# Patient Record
Sex: Female | Born: 2000
Health system: Southern US, Community
[De-identification: ages and names within clinical notes are randomized; demographics above are authoritative.]

## PROBLEM LIST (undated history)

## (undated) DIAGNOSIS — F419 Anxiety disorder, unspecified: Secondary | ICD-10-CM

## (undated) HISTORY — PX: EYE SURGERY: SHX253

## (undated) HISTORY — DX: Anxiety disorder, unspecified: F41.9

---
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
## 2021-10-18 ENCOUNTER — Encounter (HOSPITAL_COMMUNITY): Payer: Self-pay

## 2021-10-18 ENCOUNTER — Ambulatory Visit (HOSPITAL_COMMUNITY)
Admission: EM | Admit: 2021-10-18 | Discharge: 2021-10-18 | Disposition: A | Discharge status: Home / Self Care | Payer: 59 | Source: Ambulatory Visit | Attending: Family Medicine | Admitting: Family Medicine

## 2021-10-18 VITALS — BP 120/76 | HR 73 | Temp 98.6°F | Resp 16

## 2021-10-18 DIAGNOSIS — H1033 Unspecified acute conjunctivitis, bilateral: Secondary | ICD-10-CM | POA: Diagnosis not present

## 2021-10-18 DIAGNOSIS — H1030 Unspecified acute conjunctivitis, unspecified eye: Secondary | ICD-10-CM

## 2021-10-18 MED ORDER — MOXIFLOXACIN HCL 0.5 % OP SOLN: 1.0000 [drp] | Freq: Three times a day (TID) | OPHTHALMIC | 0 refills | Status: AC

## 2021-10-18 NOTE — ED Triage Notes (Signed)
C/o bilateral eye redness and swelling that started yesterday.  ?

## 2021-10-18 NOTE — ED Provider Notes (Signed)
?[facility] ? ? ? ?CSN: [id number] ?Arrival date & time: 10/18/21  1449 ? ? ?  ? ?History   ?Chief Complaint ?Chief Complaint  ?Patient presents with  ? Eye Problem  ?  Entered by patient  ? ? ?HPI ?[name] is a 21 y.o. female.  ? ?Patient is here for eye redness and drainage that started yesterday.  Right worse than left.  Drainage is tears/watering.  ?She wears contacts, and slightly sensative when she blinks.  ?Not very itchy.  ?She is not wearing contacts currently.  ?No FB noted or any injury.  ? ?History reviewed. No pertinent past medical history. ? ?Patient Active Problem List  ? Diagnosis Date Noted  ? Ovarian cyst rupture 04/03/2019  ? ? ?Past Surgical History:  ?Procedure Laterality Date  ? LAPAROSCOPIC APPENDECTOMY N/A 04/03/2019  ? Procedure: APPENDECTOMY LAPAROSCOPIC;  Surgeon: Leonia Corona, MD;  Location: [facility] OR;  Service: Pediatrics;  Laterality: N/A;  ? ? ?OB History   ?No obstetric history on file. ?  ? ? ? ?Home Medications   ? ?Prior to Admission medications   ?Not on File  ? ? ?Family History ?History reviewed. No pertinent family history. ? ?Social History ?Social History  ? ?Tobacco Use  ? Smoking status: Never  ? Smokeless tobacco: Never  ?Substance Use Topics  ? Alcohol use: Never  ? Drug use: Never  ? ? ? ?Allergies   ?Patient has no known allergies. ? ? ?Review of Systems ?Review of Systems  ?Constitutional: Negative.   ?HENT: Negative.    ?Eyes:  Positive for photophobia, discharge and redness.  ?Respiratory: Negative.    ?Cardiovascular: Negative.   ?Gastrointestinal: Negative.   ? ? ?Physical Exam ?Triage Vital Signs ?ED Triage Vitals [10/18/21 1510]  ?Enc Vitals Group  ?   BP 120/76  ?   Pulse Rate 73  ?   Resp 16  ?   Temp 98.6 ?F (37 ?C)  ?   Temp Source Oral  ?   SpO2 100 %  ?   Weight   ?   Height   ?   Head Circumference   ?   Peak Flow   ?   Pain Score   ?   Pain Loc   ?   Pain Edu?   ?   Excl. in GC?   ? ?No data found. ? ?Updated Vital Signs ?BP 120/76 (BP  Location: Left Arm)   Pulse 73   Temp 98.6 ?F (37 ?C) (Oral)   Resp 16   SpO2 100%  ? ?Visual Acuity ?Right Eye Distance:   ?Left Eye Distance:   ?Bilateral Distance:   ? ?Right Eye Near:   ?Left Eye Near:    ?Bilateral Near:    ? ?Physical Exam ?Constitutional:   ?   Appearance: Normal appearance.  ?HENT:  ?   Head: Normocephalic and atraumatic.  ?Eyes:  ?   Conjunctiva/sclera:  ?   Right eye: Right conjunctiva is injected. Exudate and hemorrhage present.  ?   Left eye: Left conjunctiva is injected.  ?Cardiovascular:  ?   Rate and Rhythm: Normal rate and regular rhythm.  ?Pulmonary:  ?   Effort: Pulmonary effort is normal.  ?   Breath sounds: Normal breath sounds.  ?Neurological:  ?   Mental Status: She is alert.  ? ? ? ?UC Treatments / Results  ?Labs ?(all labs ordered are listed, but only abnormal results are displayed) ?Labs Reviewed - No data to  display ? ?EKG ? ? ?Radiology ?No results found. ? ?Procedures ?Procedures (including critical care time) ? ?Medications Ordered in UC ?Medications - No data to display ? ?Initial Impression / Assessment and Plan / UC Course  ?I have reviewed the triage vital signs and the nursing notes. ? ?Pertinent labs & imaging results that were available during my care of the patient were reviewed by me and considered in my medical decision making (see chart for details). ? ?  ? ?Final Clinical Impressions(s) / UC Diagnoses  ? ?Final diagnoses:  ?Acute conjunctivitis, unspecified acute conjunctivitis type, unspecified laterality  ? ? ? ?Discharge Instructions   ? ?  ?You were seen for conjuctivitis.  I have sent out an antibiotic eye drop to use three times/day x 7 days.  I also recommend you use over the counter zaditor eye drops for any allergic component.  ?If your symptoms do not improve, or worsen, then please see your eye doctor or go to the ER for evaluation.  ? ? ? ?ED Prescriptions   ? ? Medication Sig Dispense Auth. Provider  ? moxifloxacin (VIGAMOX) 0.5 % ophthalmic  solution Place 1 drop into both eyes 3 (three) times daily for 7 days. 3 mL Jannifer Franklin, MD  ? ?  ? ?PDMP not reviewed this encounter. ?  Jannifer Franklin, MD ?10/18/21 1538 ? ?

## 2021-10-18 NOTE — Discharge Instructions (Signed)
You were seen for conjuctivitis.  I have sent out an antibiotic eye drop to use three times/day x 7 days.  I also recommend you use over the counter zaditor eye drops for any allergic component.  ?If your symptoms do not improve, or worsen, then please see your eye doctor or go to the ER for evaluation.  ?

## 2022-07-01 ENCOUNTER — Ambulatory Visit: Payer: 59 | Admitting: Nurse Practitioner

## 2022-07-08 ENCOUNTER — Encounter: Payer: Self-pay | Admitting: Nurse Practitioner

## 2022-07-08 ENCOUNTER — Ambulatory Visit: Payer: 59 | Admitting: Nurse Practitioner

## 2022-07-08 VITALS — BP 112/70 | HR 78 | Temp 98.1°F | Ht 62.5 in | Wt 133.2 lb

## 2022-07-08 DIAGNOSIS — Z0000 Encounter for general adult medical examination without abnormal findings: Secondary | ICD-10-CM | POA: Diagnosis not present

## 2022-07-08 DIAGNOSIS — N946 Dysmenorrhea, unspecified: Secondary | ICD-10-CM | POA: Insufficient documentation

## 2022-07-08 HISTORY — DX: Encounter for general adult medical examination without abnormal findings: Z00.00

## 2022-07-08 NOTE — Patient Instructions (Addendum)
It was a pleasure to meet you today.   If you have any needs, I am here.

## 2022-07-08 NOTE — Progress Notes (Signed)
[name], DNP, AGNP-c [facility] [address] [place], [place] [address] Main Office [phone]  BP 112/70   Pulse 78   Temp 98.1 F (36.7 C)   Ht 5' 2.5" (1.588 m)   Wt 133 lb 3.2 oz (60.4 kg)   LMP 07/02/2022   BMI 23.97 kg/m    Subjective:    Patient ID: Jillian Harris, female    DOB: November 09, 2000, 21 y.o.   MRN: [id number]  HPI: ANGLE DIRUSSO is a 21 y.o. female presenting on 07/08/2022 for comprehensive medical examination as a new patient.   Current medical concerns include:none  She reports regular vision exams q1-5y: Yes  She reports regular dental exams q 44m:  Yes  The patient eats a regular, healthy diet. She endorses exercise and/or activity of:  On her feet with work and walking a lot.   She endorses the following: Marital Status: single Living situation: with room mate Sexual: monogamous Occupation: Financial trader  She denies concerns with STI today, testing was not ordered  A comprehensive review of systems was negative.  Most Recent Depression Screen:     07/08/2022    9:07 AM  Depression screen PHQ 2/9  Decreased Interest 0  Down, Depressed, Hopeless 0  PHQ - 2 Score 0   Most Recent Anxiety Screen:      No data to display         Most Recent Fall Screen:    07/08/2022    9:07 AM  Fall Risk   Falls in the past year? 0  Number falls in past yr: 0  Injury with Fall? 0  Risk for fall due to : No Fall Risks  Follow up Falls evaluation completed    Past medical history, surgical history, medications, allergies, family history and social history reviewed with patient today and changes made to appropriate areas of the chart.  Past Medical History:  Past Medical History:  Diagnosis Date   Anxiety 2010   have always had anxiety growing up and still today   Encounter for medical examination to establish care 07/08/2022   Medications:  Current Outpatient Medications on File Prior to Visit  Medication Sig    levonorgestrel (MIRENA, 52 MG,) 20 MCG/DAY IUD 1 each by Intrauterine route once.   No current facility-administered medications on file prior to visit.   Surgical History:  Past Surgical History:  Procedure Laterality Date   EYE SURGERY  2010   LAPAROSCOPIC APPENDECTOMY N/A 04/03/2019   Procedure: APPENDECTOMY LAPAROSCOPIC;  Surgeon: Leonia Corona, MD;  Location: [facility] OR;  Service: Pediatrics;  Laterality: N/A;   Allergies:  No Known Allergies Family History:  Family History  Problem Relation Age of Onset   Anxiety disorder Mother    Depression Mother    Heart disease Paternal Grandfather        Objective:    BP 112/70   Pulse 78   Temp 98.1 F (36.7 C)   Ht 5' 2.5" (1.588 m)   Wt 133 lb 3.2 oz (60.4 kg)   LMP 07/02/2022   BMI 23.97 kg/m   Wt Readings from Last 3 Encounters:  07/08/22 133 lb 3.2 oz (60.4 kg) (59 %, Z= 0.22)*  04/03/19 125 lb 7.1 oz (56.9 kg) (59 %, Z= 0.22)*   * Growth percentiles are based on CDC (Girls, 2-20 Years) data.    Physical Exam Vitals and nursing note reviewed.  Constitutional:      General: She is not in acute distress.  Appearance: Normal appearance.  HENT:     Head: Normocephalic and atraumatic.     Right Ear: Hearing, tympanic membrane, ear canal and external ear normal.     Left Ear: Hearing, tympanic membrane, ear canal and external ear normal.     Nose: Nose normal.     Right Sinus: No maxillary sinus tenderness or frontal sinus tenderness.     Left Sinus: No maxillary sinus tenderness or frontal sinus tenderness.     Mouth/Throat:     Lips: Pink.     Mouth: Mucous membranes are moist.     Pharynx: Oropharynx is clear.  Eyes:     General: Lids are normal. Vision grossly intact.     Extraocular Movements: Extraocular movements intact.     Conjunctiva/sclera: Conjunctivae normal.     Pupils: Pupils are equal, round, and reactive to light.     Funduscopic exam:    Right eye: Red reflex present.        Left eye: Red  reflex present.    Visual Fields: Right eye visual fields normal and left eye visual fields normal.  Neck:     Thyroid: No thyromegaly.     Vascular: No carotid bruit.  Cardiovascular:     Rate and Rhythm: Normal rate and regular rhythm.     Chest Wall: PMI is not displaced.     Pulses: Normal pulses.          Dorsalis pedis pulses are 2+ on the right side and 2+ on the left side.       Posterior tibial pulses are 2+ on the right side and 2+ on the left side.     Heart sounds: Normal heart sounds. No murmur heard. Pulmonary:     Effort: Pulmonary effort is normal. No respiratory distress.     Breath sounds: Normal breath sounds.  Abdominal:     General: Abdomen is flat. Bowel sounds are normal. There is no distension.     Palpations: Abdomen is soft. There is no hepatomegaly, splenomegaly or mass.     Tenderness: There is no abdominal tenderness. There is no right CVA tenderness, left CVA tenderness, guarding or rebound.  Musculoskeletal:        General: Normal range of motion.     Cervical back: Full passive range of motion without pain, normal range of motion and neck supple. No tenderness.     Right lower leg: No edema.     Left lower leg: No edema.  Feet:     Left foot:     Toenail Condition: Left toenails are normal.  Lymphadenopathy:     Cervical: No cervical adenopathy.     Upper Body:     Right upper body: No supraclavicular adenopathy.     Left upper body: No supraclavicular adenopathy.  Skin:    General: Skin is warm and dry.     Capillary Refill: Capillary refill takes less than 2 seconds.     Nails: There is no clubbing.  Neurological:     General: No focal deficit present.     Mental Status: She is alert and oriented to person, place, and time.     GCS: GCS eye subscore is 4. GCS verbal subscore is 5. GCS motor subscore is 6.     Sensory: Sensation is intact.     Motor: Motor function is intact.     Coordination: Coordination is intact.     Gait: Gait is  intact.     Deep  Tendon Reflexes: Reflexes are normal and symmetric.  Psychiatric:        Attention and Perception: Attention normal.        Mood and Affect: Mood normal.        Speech: Speech normal.        Behavior: Behavior normal. Behavior is cooperative.        Thought Content: Thought content normal.        Cognition and Memory: Cognition and memory normal.        Judgment: Judgment normal.     Results for orders placed or performed during the hospital encounter of 04/02/19  SARS Coronavirus 2 [employer] order, Performed in [facility] hospital lab) Nasopharyngeal Nasopharyngeal Swab   Specimen: Nasopharyngeal Swab  Result Value Ref Range   SARS Coronavirus 2 NEGATIVE NEGATIVE  CBC with Differential  Result Value Ref Range   WBC 8.4 4.5 - 13.5 K/uL   RBC 4.15 3.80 - 5.70 MIL/uL   Hemoglobin 12.6 12.0 - 16.0 g/dL   HCT 82.9 56.2 - 13.0 %   MCV 89.9 78.0 - 98.0 fL   MCH 30.4 25.0 - 34.0 pg   MCHC 33.8 31.0 - 37.0 g/dL   RDW 86.5 78.4 - 69.6 %   Platelets 362 150 - 400 K/uL   nRBC 0.0 0.0 - 0.2 %   Neutrophils Relative % 55 %   Neutro Abs 4.6 1.7 - 8.0 K/uL   Lymphocytes Relative 33 %   Lymphs Abs 2.8 1.1 - 4.8 K/uL   Monocytes Relative 9 %   Monocytes Absolute 0.8 0.2 - 1.2 K/uL   Eosinophils Relative 2 %   Eosinophils Absolute 0.2 0.0 - 1.2 K/uL   Basophils Relative 1 %   Basophils Absolute 0.1 0.0 - 0.1 K/uL   Immature Granulocytes 0 %   Abs Immature Granulocytes 0.02 0.00 - 0.07 K/uL  Comprehensive metabolic panel  Result Value Ref Range   Sodium 138 135 - 145 mmol/L   Potassium 4.0 3.5 - 5.1 mmol/L   Chloride 106 98 - 111 mmol/L   CO2 25 22 - 32 mmol/L   Glucose, Bld 90 70 - 99 mg/dL   BUN 12 4 - 18 mg/dL   Creatinine, Ser 2.95 0.50 - 1.00 mg/dL   Calcium 8.9 8.9 - 28.4 mg/dL   Total Protein 6.8 6.5 - 8.1 g/dL   Albumin 4.0 3.5 - 5.0 g/dL   AST 18 15 - 41 U/L   ALT 12 0 - 44 U/L   Alkaline Phosphatase 45 (L) 47 - 119 U/L   Total Bilirubin 1.5 (H) 0.3 -  1.2 mg/dL   GFR calc non Af Amer NOT CALCULATED >60 mL/min   GFR calc Af Amer NOT CALCULATED >60 mL/min   Anion gap 7 5 - 15  Urinalysis, Routine w reflex microscopic  Result Value Ref Range   Color, Urine STRAW (A) YELLOW   APPearance CLEAR CLEAR   Specific Gravity, Urine 1.004 (L) 1.005 - 1.030   pH 6.0 5.0 - 8.0   Glucose, UA NEGATIVE NEGATIVE mg/dL   Hgb urine dipstick SMALL (A) NEGATIVE   Bilirubin Urine NEGATIVE NEGATIVE   Ketones, ur NEGATIVE NEGATIVE mg/dL   Protein, ur NEGATIVE NEGATIVE mg/dL   Nitrite NEGATIVE NEGATIVE   Leukocytes,Ua TRACE (A) NEGATIVE   RBC / HPF 0-5 0 - 5 RBC/hpf   WBC, UA 0-5 0 - 5 WBC/hpf   Bacteria, UA NONE SEEN NONE SEEN   Squamous Epithelial / LPF 0-5 0 -  5  Pregnancy, urine  Result Value Ref Range   Preg Test, Ur NEGATIVE NEGATIVE    IMMUNIZATIONS:   Flu: Flu vaccine declined, patient does not wish to complete Prevnar 13: Prevnar 13 N/A for this patient Pneumovax 23: Pneumovax 23 N/A for this patient Vac Shingrix: Shingrix N/A for this patient HPV: HPV completed, documentation needed (Dose # all) Tdap: Unknown- will review records  HEALTH MAINTENANCE: Pap Smear HM Status: is not applicable for this patient Mammogram HM Status: is not applicable for this patient Colon Cancer Screening HM Status: is not applicable for this patient Bone Density HM Status: is not applicable for this patient STI Testing HM Status: is not applicable for this patient  Eye Exam HM Status: is not applicable for this patient Urine Micro HM Status: is not applicable for this patient  Spirometry HM Status: is not applicable for this patient      Assessment & Plan:   Problem List Items Addressed This Visit     Encounter for medical examination to establish care - Primary    CPE completed today. Will need to obtain records for tdap and HPV vaccines.  No labs needed at this time.  No abnormalities noted on exam.  Review of HM activities and recommendations  discussed and provided on AVS Anticipatory guidance, diet, and exercise recommendations provided.  Medications, allergies, and hx reviewed and updated as necessary.  Plan to f/u with CPE in 1 year or sooner for acute/chronic health needs as directed.        Other Visit Diagnoses     Health care maintenance              Follow up plan: Return in about 1 year (around 07/09/2023) for CPE.  NEXT PREVENTATIVE PHYSICAL DUE IN 1 YEAR.  PATIENT COUNSELING PROVIDED FOR ALL ADULT PATIENTS:  Consume a well balanced diet low in saturated fats, cholesterol, and moderation in carbohydrates.   This can be as simple as monitoring portion sizes and cutting back on sugary beverages such as soda and juice to start with.    Daily water consumption of at least 64 ounces.  Physical activity at least 180 minutes per week, if just starting out.   This can be as simple as taking the stairs instead of the elevator and walking 2-3 laps around the office  purposefully every day.   STD protection, partner selection, and regular testing if high risk.  Limited consumption of alcoholic beverages if alcohol is consumed.  For women, I recommend no more than 7 alcoholic beverages per week, spread out throughout the week.  Avoid "binge" drinking or consuming large quantities of alcohol in one setting.   Please let me know if you feel you may need help with reduction or quitting alcohol consumption.   Avoidance of nicotine, if used.  Please let me know if you feel you may need help with reduction or quitting nicotine use.   Daily mental health attention.  This can be in the form of 5 minute daily meditation, prayer, journaling, yoga, reflection, etc.   Purposeful attention to your emotions and mental state can significantly improve your overall wellbeing and Health.  Please know that I am here to help you with all of your health care goals and am happy to work with you to find a solution that works best for  you.  The greatest advice I have received with any changes in life are to take it one step at a time, that even  means if all you can focus on is the next 60 seconds, then do that and celebrate your victories.  With any changes in life, you will have set backs, and that is OK. The important thing to remember is, if you have a set back, it is not a failure, it is an opportunity to try again!  Health Maintenance Recommendations Screening Testing Mammogram Every 1 -2 years based on history and risk factors Starting at age 60 Pap Smear Ages 21-39 every 3 years Ages 47-65 every 5 years with HPV testing More frequent testing may be required based on results and history Colon Cancer Screening Every 1-10 years based on test performed, risk factors, and history Starting at age 58 Bone Density Screening Every 2-10 years based on history Starting at age 4 for women Recommendations for men differ based on medication usage, history, and risk factors AAA Screening One time ultrasound Men 16-80 years old who have every smoked Lung Cancer Screening Low Dose Lung CT every 12 months Age 39-80 years with a 30 pack-year smoking history who still smoke or who have quit within the last 15 years  Screening Labs Routine  Labs: Complete Blood Count (CBC), Complete Metabolic Panel (CMP), Cholesterol (Lipid Panel) Every 6-12 months based on history and medications May be recommended more frequently based on current conditions or previous results Hemoglobin A1c Lab Every 3-12 months based on history and previous results Starting at age 40 or earlier with diagnosis of diabetes, high cholesterol, BMI >26, and/or risk factors Frequent monitoring for patients with diabetes to ensure blood sugar control Thyroid Panel (TSH w/ T3 & T4) Every 6 months based on history, symptoms, and risk factors May be repeated more often if on medication HIV One time testing for all patients 44 and older May be repeated more  frequently for patients with increased risk factors or exposure Hepatitis C One time testing for all patients 46 and older May be repeated more frequently for patients with increased risk factors or exposure Gonorrhea, Chlamydia Every 12 months for all sexually active persons 13-24 years Additional monitoring may be recommended for those who are considered high risk or who have symptoms PSA Men 73-59 years old with risk factors Additional screening may be recommended from age 68-69 based on risk factors, symptoms, and history  Vaccine Recommendations Tetanus Booster All adults every 10 years Flu Vaccine All patients 6 months and older every year COVID Vaccine All patients 12 years and older Initial dosing with booster May recommend additional booster based on age and health history HPV Vaccine 2 doses all patients age 40-26 Dosing may be considered for patients over 26 Shingles Vaccine (Shingrix) 2 doses all adults 55 years and older Pneumonia (Pneumovax 23) All adults 65 years and older May recommend earlier dosing based on health history Pneumonia (Prevnar 61) All adults 65 years and older Dosed 1 year after Pneumovax 23  Additional Screening, Testing, and Vaccinations may be recommended on an individualized basis based on family history, health history, risk factors, and/or exposure.

## 2022-07-08 NOTE — Assessment & Plan Note (Signed)
CPE completed today. Will need to obtain records for tdap and HPV vaccines.  No labs needed at this time.  No abnormalities noted on exam.  Review of HM activities and recommendations discussed and provided on AVS Anticipatory guidance, diet, and exercise recommendations provided.  Medications, allergies, and hx reviewed and updated as necessary.  Plan to f/u with CPE in 1 year or sooner for acute/chronic health needs as directed.

## 2022-10-08 ENCOUNTER — Encounter: Payer: Self-pay | Admitting: Family Medicine

## 2022-10-08 ENCOUNTER — Other Ambulatory Visit (INDEPENDENT_AMBULATORY_CARE_PROVIDER_SITE_OTHER): Payer: 59

## 2022-10-08 ENCOUNTER — Telehealth (INDEPENDENT_AMBULATORY_CARE_PROVIDER_SITE_OTHER): Payer: 59 | Admitting: Family Medicine

## 2022-10-08 VITALS — Temp 98.7°F | Ht 62.5 in | Wt 133.0 lb

## 2022-10-08 DIAGNOSIS — R059 Cough, unspecified: Secondary | ICD-10-CM | POA: Diagnosis not present

## 2022-10-08 DIAGNOSIS — J069 Acute upper respiratory infection, unspecified: Secondary | ICD-10-CM | POA: Diagnosis not present

## 2022-10-08 DIAGNOSIS — R0989 Other specified symptoms and signs involving the circulatory and respiratory systems: Secondary | ICD-10-CM | POA: Diagnosis not present

## 2022-10-08 DIAGNOSIS — R509 Fever, unspecified: Secondary | ICD-10-CM

## 2022-10-08 DIAGNOSIS — J029 Acute pharyngitis, unspecified: Secondary | ICD-10-CM

## 2022-10-08 DIAGNOSIS — J302 Other seasonal allergic rhinitis: Secondary | ICD-10-CM | POA: Diagnosis not present

## 2022-10-08 LAB — POCT INFLUENZA A/B
Influenza A, POC: NEGATIVE
Influenza B, POC: NEGATIVE

## 2022-10-08 LAB — POC COVID19 BINAXNOW: SARS Coronavirus 2 Ag: NEGATIVE

## 2022-10-08 NOTE — Patient Instructions (Addendum)
Stay well hydrated. I recommend either Mucinex DM 12 hour pill, taken twice daily (vs Robitussin DM, taken more frequently, per the bottle). Don't use Nyquil with this. You can use tylenol along with the mucinex, as needed for fever or pain. It is possible that allergies are contributing to your drainage (throat pain and cough). Using an antihistamine such as zyrtec, claritin or allegra may help.  You can use the D version or a separate decongestant (sudafed) if needed, to further dry up the nose or help with any sinus pain.  (You can take a benadryl at bedtime if needed for sleep (nyquil contains a sedating antihistamine))  Please return for re-evaluation if you develop persistent fever, worsening cough, shortness of breath, pain with breathing, discolored mucus or phlegm, sinus pain.

## 2022-10-08 NOTE — Progress Notes (Signed)
Start time: 11:20 End time: 11:39  Virtual Visit via Video Note  I connected with Jillian Harris on 10/08/22 by a video enabled telemedicine application and verified that I am speaking with the correct person using two identifiers.  Location: Patient: in parked car (at our office) Provider: office   I discussed the limitations of evaluation and management by telemedicine and the availability of in person appointments. The patient expressed understanding and agreed to proceed.  History of Present Illness:  Chief Complaint  Patient presents with   Sore Throat    VIRTUAL fever, st and just starting coughing last night. Other symptoms started Sunday night. Tested for covid yesterday and was negative. No strep exposures. Has a runny nose and some drainage. Does not have a thermometer but knows she had a fever last night.    3/17 evening, after work she noted a slight cough.  3/18 morning she woke up with sore throat, body felt tired.  Felt similar to strep in the past, though later admits that it feels different than strep. Yesterday she started coughing more. She had subjective fever for the last 2 nights, no chills.  She took Nyquil last night, but still woke up coughing a lot. This morning she feels a lot of PND, still coughing some.  This morning her nose started running/stuffy (hasn't blown her nose yet to look at color). Cough is nonproductive. Denies chest congestion, shortness of breath, chest pain. Glands feel a little sore, "sensitive" in her neck, doesn't notice enlarged nodes.  COVID test was negative yesterday.  She has some allergies, and thinks that is likely a component of her symptoms. Denies itchy watery eyes.  Usually affects her nose and throat.  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 10/08/2022: Last dose 2 @ 3:00pm   levonorgestrel (MIRENA, 52 MG,) 20 MCG/DAY IUD 1 each by Intrauterine route once.    No facility-administered encounter medications on file as of 10/08/2022.   No Known Allergies  ROS:  subjective fever, no chills. +congestion, cough, sore throat, cough, per HPI.  No headache, dizziness, chest pain, shortness of breath, swollen glands, n/v/d or rash.   Observations/Objective:  Temp 98.7 F (37.1 C) (Tympanic) Comment: no thermometer  Ht 5' 2.5" (1.588 m)   Wt 133 lb (60.3 kg)   LMP 09/24/2022 (Approximate)   BMI 23.94 kg/m   Pleasant, well-appearing female, seated in parked car, in no distress. Some sniffling during the visit. No coughing during visit.  Alert and oriented. Cranial nerves are grossly intact. Exam is limited due to the virtual nature of the visit.  Influenza A&B negative COVID negative  Assessment and Plan:  Viral upper respiratory illness - supportive measures reviewed, s/sx for which to return reviewed  Seasonal allergies - antihistamines, +/- decongestant recommended, as well as guaifenesin/DM for her cough and congestion (also may be viral)  Stay well hydrated. I recommend either Mucinex DM 12 hour pill, taken twice daily (vs Robitussin DM, taken more frequently, per the bottle). Don't use Nyquil with this. You can use tylenol along with the mucinex, as needed for fever or pain. It is possible that allergies are contributing to your drainage (throat pain and cough). Using an antihistamine such as zyrtec, claritin or allegra may help.  You can use the D version or a separate decongestant (sudafed) if needed, to further dry up the nose or help with any sinus pain.  (You can take a benadryl at bedtime if needed for sleep (nyquil contains a sedating antihistamine))  Please return for re-evaluation if you develop persistent fever, worsening cough, shortness of breath, pain with  breathing, discolored mucus or phlegm, sinus pain.    Follow Up Instructions:    I discussed the assessment and treatment plan with the patient. The patient was provided an opportunity to ask questions and all were answered. The patient agreed with the plan and demonstrated an understanding of the instructions.   The patient was advised to call back or seek an in-person evaluation if the symptoms worsen or if the condition fails to improve as anticipated.  I spent 25 minutes dedicated to the care of this patient, including pre-visit review of records, face to face time, post-visit ordering of testing and documentation.    Vikki Ports, MD

## 2022-10-13 ENCOUNTER — Ambulatory Visit: Payer: 59 | Admitting: Family Medicine

## 2022-10-13 ENCOUNTER — Encounter: Payer: Self-pay | Admitting: Family Medicine

## 2022-10-13 VITALS — BP 100/60 | HR 60 | Temp 98.5°F | Ht 62.5 in | Wt 130.2 lb

## 2022-10-13 DIAGNOSIS — R059 Cough, unspecified: Secondary | ICD-10-CM | POA: Diagnosis not present

## 2022-10-13 DIAGNOSIS — J019 Acute sinusitis, unspecified: Secondary | ICD-10-CM

## 2022-10-13 MED ORDER — AMOXICILLIN 500 MG PO TABS: 1000.0000 mg | ORAL_TABLET | Freq: Two times a day (BID) | ORAL | 0 refills | Status: DC

## 2022-10-13 MED ORDER — BENZONATATE 200 MG PO CAPS: 200.0000 mg | ORAL_CAPSULE | Freq: Three times a day (TID) | ORAL | 0 refills | Status: DC | PRN

## 2022-10-13 NOTE — Patient Instructions (Addendum)
Stay well hydrated. I recommend Mucinex DM 12 hour pill, taken twice daily (vs Robitussin DM, taken more frequently, per the bottle). Don't use Nyquil with this. You can use tylenol along with the mucinex, as needed for fever or pain. It is possible that allergies are contributing to your drainage (throat pain and cough). Using an antihistamine such as zyrtec, claritin or allegra may help. You can use the D version or a separate decongestant (sudafed) if needed, to further dry up the nose or help with any sinus pain.  Above were the instructions from last visit, which are still valid.  At this point, you likely have a sinus infection. Continue sinus rinses, no more than twice daily. You may use a decongestant, as stated above (sudafed) to help with ear and sinus pain.  Take the antibiotics as directed. This can take up to 5 days to start seeing the full effect, so be patient, and use these other medications to help with your symptoms.  Take the benzonatate as needed for cough.  Contact us if your symptoms aren't improving, if you develop fever, chest pain, shortness of breath, pain with breathing, nausea and vomiting--these things could indicate pneumonia or other issues which should be re-evaluated.

## 2022-10-13 NOTE — Progress Notes (Signed)
Chief Complaint  Patient presents with   Cough    Cough, congestion, coughing up lime green mucus. Coughed up so hard yesterday she vomited. Cannot sleep because of cough. Had fever, chills and body aches in the beginning. Ear pain. Symptoms started last Sunday. Did virtual with you Wed and covid was negative. Went to [facility] next day and has strep test and was negative.    She was seen for a virtual visit 3/20.  Symptoms had started 3/17, sore throat, congestion, cough, subjective fever, no chills.   COVID and flu tests were negative.  She had negative strep test the following day.  2 days ago the cough became productive of lime green phlegm (had been nonproductive). The nasal drainage is also green. She has throbbing headache, between and behind her eyes. She has some aching in her ears, and still has some throat pain. Coughing a lot, to the point of post-tussive emesis (white foam noted in emesis, not green mucus). Still has a dry cough.  No nausea, just the gagging related to the coughing.  Using [employer] frequenty  She tried robitussin, didn't help. Nyquil at night to sleep, and ibuprofen for headaches.   PMH, PSH, SH reviewed  Outpatient Encounter Medications as of 10/13/2022  Medication Sig Note   levonorgestrel (MIRENA, 52 MG,) 20 MCG/DAY IUD 1 each by Intrauterine route once.    [DISCONTINUED] acetaminophen (TYLENOL) 500 MG tablet Take 1,000 mg by mouth every 6 (six) hours as needed. 10/08/2022: Last dose 2 @ 3:00pm   No facility-administered encounter medications on file as of 10/13/2022.   No Known Allergies  ROS: no further fever.  +HA, sinus pain, cough, sore throat and ear pain per HPI. No nausea.  +post-tussive emesis. Slight diarrhea, not eating very much. No urinary complaints. No rashes   PHYSICAL EXAM:  BP 100/60   Pulse 60   Temp 98.5 F (36.9 C) (Tympanic)   Ht 5' 2.5" (1.588 m)   Wt 130 lb 3.2 oz (59.1 kg)   LMP 09/24/2022 (Approximate)   BMI 23.43  kg/m   Well-appearing, pleasant female, in no distress. Frequent dry cough during visit, but speaking easily and comfortably. HEENT: conjunctiva and sclera are clear, EOMI. OP clear.  Sinuses nontender. Ears--Some effusion present bilaterally, no erythema, normal EAC's Nasal mucosa mild-mod edema, L>R, slight yellow noted on left, no mucus on the right Neck: Minimally shotty ant cervical LAD, nontender Heart: regular rate and rhythm, no murmur Lungs: clear bilaterally, no wheezes, rales, ronchi Extremities: no edema Neuro: alert and oriented, cranial nerves grossly normal, normal gait.   ASSESSMENT/PLAN:  Acute non-recurrent sinusitis, unspecified location - amox, Mucinex-DM 12 hr BID, tessalon prn. Cont sinus rinses. Sudafed prn - Plan: amoxicillin (AMOXIL) 500 MG tablet  Cough, unspecified type - likely due to PND, related to sinus infection (+/- allergies) - Plan: benzonatate (TESSALON) 200 MG capsule  F/u if sx persist or worsen

## 2022-12-19 ENCOUNTER — Encounter: Payer: Self-pay | Admitting: Nurse Practitioner

## 2022-12-19 ENCOUNTER — Ambulatory Visit: Payer: 59 | Admitting: Nurse Practitioner

## 2022-12-19 VITALS — BP 112/70 | HR 75 | Wt 131.0 lb

## 2022-12-19 DIAGNOSIS — R0609 Other forms of dyspnea: Secondary | ICD-10-CM

## 2022-12-19 DIAGNOSIS — E559 Vitamin D deficiency, unspecified: Secondary | ICD-10-CM

## 2022-12-19 DIAGNOSIS — K219 Gastro-esophageal reflux disease without esophagitis: Secondary | ICD-10-CM

## 2022-12-19 DIAGNOSIS — Z862 Personal history of diseases of the blood and blood-forming organs and certain disorders involving the immune mechanism: Secondary | ICD-10-CM

## 2022-12-19 DIAGNOSIS — R5382 Chronic fatigue, unspecified: Secondary | ICD-10-CM

## 2022-12-19 DIAGNOSIS — N946 Dysmenorrhea, unspecified: Secondary | ICD-10-CM

## 2022-12-19 MED ORDER — ALBUTEROL SULFATE HFA 108 (90 BASE) MCG/ACT IN AERS
1.0000 | INHALATION_SPRAY | Freq: Four times a day (QID) | RESPIRATORY_TRACT | 3 refills | Status: AC | PRN
Start: 2022-12-19 — End: ?

## 2022-12-19 NOTE — Patient Instructions (Addendum)
I suspect your symptoms may be related to anemia (low iron levels). I have put some information on this on the back.   If your labs don't show anything concerning, we can plan to get a chest x-ray, but since your lungs sounded clear I want to wait on that so we can be as cost effective as possible.  Everything sounded and looks perfect today.

## 2022-12-19 NOTE — Progress Notes (Signed)
Tollie Eth, DNP, AGNP-c [facility] [address] [place], [place] [address] [phone]  Subjective:   Jillian Harris is a 22 y.o. female presents to day for evaluation of: Shortness of breath Jillian Harris reports experiencing shortness of breath, which she notes began after she stopped vaping, although it persisted even when she resumed vaping. She expresses that anxiety seems to exacerbate her breathing difficulties.   She also mentions feeling lightheaded frequently, particularly during her menstrual periods, which she attributes to possible low iron levels - a condition previously noted by another healthcare provider who had recommended iron supplements, which she has since discontinued. She describes her menstrual flow as highly irregular and variable, which she attributes to her intrauterine device (IUD). She endorses intermittent palpitations, as well. She is aware of different types of anemia, including iron deficiency and vitamin B12 deficiency, and expresses a willingness to undergo diagnostic testing to clarify her condition.  Jillian Harris confirms having acid reflux, which she acknowledges could be contributing to her respiratory symptoms. She denies experiencing any numbness, tingling, or severe chest pain, which could indicate more serious cardiovascular issues.   PMH, Medications, and Allergies reviewed and updated in chart as appropriate.   ROS negative except for what is listed in HPI. Objective:  BP 112/70   Pulse 75   Wt 131 lb (59.4 kg)   LMP 12/12/2022   BMI 23.58 kg/m  Physical Exam Vitals and nursing note reviewed.  Constitutional:      Appearance: Normal appearance.  HENT:     Head: Normocephalic.  Eyes:     Pupils: Pupils are equal, round, and reactive to light.  Neck:     Vascular: No carotid bruit.  Cardiovascular:     Rate and Rhythm: Normal rate and regular rhythm.     Pulses: Normal pulses.     Heart sounds: Normal heart sounds. No murmur  heard. Pulmonary:     Effort: Pulmonary effort is normal.     Breath sounds: Normal breath sounds. No wheezing or rhonchi.  Chest:     Chest wall: No tenderness.  Musculoskeletal:        General: Normal range of motion.     Cervical back: Normal range of motion.  Skin:    General: Skin is warm.     Coloration: Skin is pale.  Neurological:     General: No focal deficit present.     Mental Status: She is alert and oriented to person, place, and time.  Psychiatric:        Mood and Affect: Mood normal.           Assessment & Plan:   Problem List Items Addressed This Visit     Dysmenorrhea    History of heavy menstrual flow and painful periods. Currently has IUD Plan: - Monitor labs today      Dyspnea - Primary    Patient reports shortness of breath. Possible causes include iron deficiency anemia, silent reflux, or underlying asthma. We discussed the need for further evaluation. On exam there are no alarm symptoms present to warrant urgent referral or evaluation at this time.  Plan: - Check iron levels, B12 levels, and other relevant labs to assess for anemia. - If labs are normal, consider ordering a chest x-ray to evaluate for any underlying lung issues. - I have prescribed an albuterol inhaler for potential asthma exacerbation. - Educate patient on emergency symptoms and when to seek immediate medical attention.      Relevant Medications  albuterol (VENTOLIN HFA) 108 (90 Base) MCG/ACT inhaler   Other Relevant Orders   CBC with Differential/Platelet (Completed)   Iron, TIBC and Ferritin Panel (Completed)   Vitamin B12 (Completed)   VITAMIN D 25 Hydroxy (Vit-D Deficiency, Fractures) (Completed)   TSH (Completed)   History of iron deficiency anemia    Patient has a history of IDA and may have a recurrence based on reported symptoms including lightheadedness, palpitations, and shortness of breath. Consider dehydration and anxiety as alternative causes.  Plan:   - Check  iron levels, B12 levels, and other relevant labs for anemia. - Provide patient with information on iron supplementation and dietary sources of iron. - If anemia is confirmed, initiate appropriate iron supplementation and monitor response.      Relevant Orders   CBC with Differential/Platelet (Completed)   Iron, TIBC and Ferritin Panel (Completed)   Vitamin B12 (Completed)   VITAMIN D 25 Hydroxy (Vit-D Deficiency, Fractures) (Completed)   TSH (Completed)   Gastroesophageal reflux disease    Patient has a history of acid reflux, which may be contributing to reported shortness of breath. We discussed the mechanism of this today and how that can affect breathing and trigger anxiety.  Plan: - Assess for possible silent reflux contributing to shortness of breath. - Review current management strategies and consider adjustments if necessary.      Other Visit Diagnoses     Chronic fatigue       Relevant Medications   Cholecalciferol (VITAMIN D3) 1.25 MG (50000 UT) TABS   Other Relevant Orders   CBC with Differential/Platelet (Completed)   Iron, TIBC and Ferritin Panel (Completed)   Vitamin B12 (Completed)   VITAMIN D 25 Hydroxy (Vit-D Deficiency, Fractures) (Completed)   TSH (Completed)   VITAMIN D 25 Hydroxy (Vit-D Deficiency, Fractures)   Vitamin D deficiency       Relevant Medications   Cholecalciferol (VITAMIN D3) 1.25 MG (50000 UT) TABS   Other Relevant Orders   VITAMIN D 25 Hydroxy (Vit-D Deficiency, Fractures)         Tollie Eth, DNP, AGNP-c 01/15/2023  7:46 AM    History, Medications, Surgery, SDOH, and Family History reviewed and updated as appropriate.

## 2022-12-20 LAB — CBC WITH DIFFERENTIAL/PLATELET
Basophils Absolute: 0 10*3/uL (ref 0.0–0.2)
Basos: 1 %
EOS (ABSOLUTE): 0.2 10*3/uL (ref 0.0–0.4)
Eos: 4 %
Hematocrit: 36.6 % (ref 34.0–46.6)
Hemoglobin: 12.7 g/dL (ref 11.1–15.9)
Immature Grans (Abs): 0 10*3/uL (ref 0.0–0.1)
Immature Granulocytes: 0 %
Lymphocytes Absolute: 2.2 10*3/uL (ref 0.7–3.1)
Lymphs: 46 %
MCH: 30.7 pg (ref 26.6–33.0)
MCHC: 34.7 g/dL (ref 31.5–35.7)
MCV: 88 fL (ref 79–97)
Monocytes Absolute: 0.5 10*3/uL (ref 0.1–0.9)
Monocytes: 11 %
Neutrophils Absolute: 1.8 10*3/uL (ref 1.4–7.0)
Neutrophils: 38 %
Platelets: 337 10*3/uL (ref 150–450)
RBC: 4.14 x10E6/uL (ref 3.77–5.28)
RDW: 13.1 % (ref 11.7–15.4)
WBC: 4.7 10*3/uL (ref 3.4–10.8)

## 2022-12-20 LAB — IRON,TIBC AND FERRITIN PANEL
Ferritin: 20 ng/mL (ref 15–150)
Iron Saturation: 32 % (ref 15–55)
Iron: 91 ug/dL (ref 27–159)
Total Iron Binding Capacity: 284 ug/dL (ref 250–450)
UIBC: 193 ug/dL (ref 131–425)

## 2022-12-20 LAB — VITAMIN B12: Vitamin B-12: 449 pg/mL (ref 232–1245)

## 2022-12-20 LAB — TSH: TSH: 2.71 u[IU]/mL (ref 0.450–4.500)

## 2022-12-20 LAB — VITAMIN D 25 HYDROXY (VIT D DEFICIENCY, FRACTURES): Vit D, 25-Hydroxy: 25.9 ng/mL — ABNORMAL LOW (ref 30.0–100.0)

## 2023-01-06 MED ORDER — VITAMIN D3 1.25 MG (50000 UT) PO TABS
1.0000 | ORAL_TABLET | ORAL | 1 refills | Status: AC
Start: 2023-01-06 — End: ?

## 2023-01-15 ENCOUNTER — Encounter: Payer: Self-pay | Admitting: Nurse Practitioner

## 2023-01-15 DIAGNOSIS — Z862 Personal history of diseases of the blood and blood-forming organs and certain disorders involving the immune mechanism: Secondary | ICD-10-CM | POA: Insufficient documentation

## 2023-01-15 DIAGNOSIS — R0600 Dyspnea, unspecified: Secondary | ICD-10-CM | POA: Insufficient documentation

## 2023-01-15 DIAGNOSIS — K219 Gastro-esophageal reflux disease without esophagitis: Secondary | ICD-10-CM | POA: Insufficient documentation

## 2023-01-15 HISTORY — DX: Gastro-esophageal reflux disease without esophagitis: K21.9

## 2023-01-15 NOTE — Assessment & Plan Note (Signed)
Patient has a history of IDA and may have a recurrence based on reported symptoms including lightheadedness, palpitations, and shortness of breath. Consider dehydration and anxiety as alternative causes.  Plan:   - Check iron levels, B12 levels, and other relevant labs for anemia. - Provide patient with information on iron supplementation and dietary sources of iron. - If anemia is confirmed, initiate appropriate iron supplementation and monitor response.

## 2023-01-15 NOTE — Assessment & Plan Note (Signed)
History of heavy menstrual flow and painful periods. Currently has IUD Plan: - Monitor labs today

## 2023-01-15 NOTE — Assessment & Plan Note (Signed)
Patient has a history of acid reflux, which may be contributing to reported shortness of breath. We discussed the mechanism of this today and how that can affect breathing and trigger anxiety.  Plan: - Assess for possible silent reflux contributing to shortness of breath. - Review current management strategies and consider adjustments if necessary.

## 2023-01-15 NOTE — Assessment & Plan Note (Signed)
Patient reports shortness of breath. Possible causes include iron deficiency anemia, silent reflux, or underlying asthma. We discussed the need for further evaluation. On exam there are no alarm symptoms present to warrant urgent referral or evaluation at this time.  Plan: - Check iron levels, B12 levels, and other relevant labs to assess for anemia. - If labs are normal, consider ordering a chest x-ray to evaluate for any underlying lung issues. - I have prescribed an albuterol inhaler for potential asthma exacerbation. - Educate patient on emergency symptoms and when to seek immediate medical attention.

## 2023-01-27 ENCOUNTER — Encounter: Payer: Self-pay | Admitting: Nurse Practitioner

## 2023-01-27 ENCOUNTER — Ambulatory Visit: Payer: 59 | Admitting: Nurse Practitioner

## 2023-01-27 VITALS — BP 114/72 | HR 92 | Wt 129.6 lb

## 2023-01-27 DIAGNOSIS — F410 Panic disorder [episodic paroxysmal anxiety] without agoraphobia: Secondary | ICD-10-CM

## 2023-01-27 DIAGNOSIS — R4589 Other symptoms and signs involving emotional state: Secondary | ICD-10-CM | POA: Diagnosis not present

## 2023-01-27 MED ORDER — HYDROXYZINE HCL 50 MG PO TABS
25.0000 mg | ORAL_TABLET | Freq: Every evening | ORAL | 3 refills | Status: DC | PRN
Start: 2023-01-27 — End: 2023-02-22

## 2023-01-27 MED ORDER — ESCITALOPRAM OXALATE 5 MG PO TABS
5.0000 mg | ORAL_TABLET | Freq: Every day | ORAL | 3 refills | Status: DC
Start: 2023-01-27 — End: 2023-02-22

## 2023-01-27 NOTE — Patient Instructions (Signed)
I have sent in Lexapro for you to start. You can start this tonight, if you would like. You can also take the hydroxyzine for sleep if you need it tonight.   Let me know if you are feeling no different in about 2 weeks and we can change the medication.  If you have any severe reactions stop the medication immediately and let us know.   If all goes well, I would like to check up on you in about 4 weeks. We can do this virtually.

## 2023-01-27 NOTE — Progress Notes (Signed)
Tollie Eth, DNP, AGNP-c [facility] [address] [place], [place] [address] [phone]   ACUTE VISIT- ESTABLISHED PATIENT  Blood pressure 114/72, pulse 92, weight 129 lb 9.6 oz (58.8 kg).  Subjective:  HPI Jillian Harris is a 22 y.o. female presents to day for evaluation of: anxiety. The patient presents today with a chief complaint of increased anxiety, leading to irrational thoughts and fears about her health. She reports feeling like she is dying and having difficulty breathing due to anxiety. Jillian Harris has a history of trying Prozac for anxiety but experienced no relief from symptoms. She mentions that her mother also had a negative experience with Prozac.   Jillian Harris describes experiencing panic symptoms and difficulty sleeping, often staying awake until 5 a.m. due to anxiety. She reports feeling foggy and disassociated throughout the day. Jillian Harris has a history of seeing a therapist and has an upcoming appointment on the 15th.  Her anxiety seems to be primarily focused on her health, with fears of having a stroke, brain tumor, or heart attack. She acknowledges that these thoughts are irrational but is unable to control them. Jillian Harris has not tried any other medications for anxiety besides Prozac.    PMH, Medications, and Allergies reviewed and updated in chart as appropriate.   ROS negative except for what is listed in HPI. Objective:  Physical Exam Vitals and nursing note reviewed.  Constitutional:      General: She is not in acute distress.    Appearance: Normal appearance. She is not ill-appearing.  Eyes:     Conjunctiva/sclera: Conjunctivae normal.  Cardiovascular:     Rate and Rhythm: Normal rate and regular rhythm.     Pulses: Normal pulses.     Heart sounds: Normal heart sounds.  Pulmonary:     Effort: Pulmonary effort is normal.     Breath sounds: Normal breath sounds.  Musculoskeletal:     Cervical back: Normal range of motion.  Skin:    General:  Skin is warm and dry.     Capillary Refill: Capillary refill takes less than 2 seconds.  Neurological:     General: No focal deficit present.     Mental Status: She is alert and oriented to person, place, and time.  Psychiatric:        Mood and Affect: Mood normal.        Behavior: Behavior normal.         Assessment & Plan:   Problem List Items Addressed This Visit     Anxiety about health - Primary    Patient reports increased anxiety with irrational thoughts and health concerns. This appears to be an exacerbation of her underlying anxiety disorder. Her difficulty falling asleep appears to also be related to her anxiety symptoms. No SI/HI at this time. Patient aware that her thought processes are not realistic, but admits difficulty diverting her attention in spite of this. We discussed management options today.  Plan: - Start escitalopram (Lexapro) for daily anxiety management - Instruct patient to allow 2 weeks for medication to take effect - Prescribe hydroxyzine for occasional use to help with sleep/anxiety - Encourage continued therapy appointments   - Patient to report back if no improvement or if adjustments needed      Panic reaction      Time: 25 minutes, >50% spent counseling, care coordination, chart review, and documentation.    Tollie Eth, DNP, AGNP-c   History, Medications, Surgery, SDOH, and Family History reviewed and updated as appropriate.

## 2023-02-18 ENCOUNTER — Other Ambulatory Visit: Payer: Self-pay | Admitting: Nurse Practitioner

## 2023-02-18 DIAGNOSIS — F410 Panic disorder [episodic paroxysmal anxiety] without agoraphobia: Secondary | ICD-10-CM

## 2023-02-18 DIAGNOSIS — R4589 Other symptoms and signs involving emotional state: Secondary | ICD-10-CM

## 2023-03-03 DIAGNOSIS — F410 Panic disorder [episodic paroxysmal anxiety] without agoraphobia: Secondary | ICD-10-CM | POA: Insufficient documentation

## 2023-03-03 DIAGNOSIS — R4589 Other symptoms and signs involving emotional state: Secondary | ICD-10-CM | POA: Insufficient documentation

## 2023-03-03 NOTE — Assessment & Plan Note (Signed)
Patient reports increased anxiety with irrational thoughts and health concerns. This appears to be an exacerbation of her underlying anxiety disorder. Her difficulty falling asleep appears to also be related to her anxiety symptoms. No SI/HI at this time. Patient aware that her thought processes are not realistic, but admits difficulty diverting her attention in spite of this. We discussed management options today.  Plan: - Start escitalopram (Lexapro) for daily anxiety management - Instruct patient to allow 2 weeks for medication to take effect - Prescribe hydroxyzine for occasional use to help with sleep/anxiety - Encourage continued therapy appointments   - Patient to report back if no improvement or if adjustments needed

## 2023-03-09 ENCOUNTER — Encounter: Payer: 59 | Admitting: Nurse Practitioner

## 2023-03-09 NOTE — Progress Notes (Deleted)
  [name], DNP, AGNP-c [facility]  [address] [place], [place] [address] [phone]  ESTABLISHED PATIENT- Chronic Health and/or Follow-Up Visit  There were no vitals taken for this visit.    Jillian Harris is a 22 y.o. year old female presenting today for evaluation and management of chronic conditions.   ***  All ROS negative with exception of what is listed above.   PHYSICAL EXAM Physical Exam  PLAN Problem List Items Addressed This Visit   None   No follow-ups on file.  Time: *** minutes, >50% spent counseling, care coordination, chart review, and documentation.   [name], DNP, AGNP-c

## 2023-06-04 ENCOUNTER — Ambulatory Visit: Payer: 59 | Admitting: Nurse Practitioner

## 2023-06-04 ENCOUNTER — Encounter: Payer: Self-pay | Admitting: Nurse Practitioner

## 2023-06-04 VITALS — BP 118/72 | HR 80 | Wt 140.6 lb

## 2023-06-04 DIAGNOSIS — J069 Acute upper respiratory infection, unspecified: Secondary | ICD-10-CM

## 2023-06-04 DIAGNOSIS — B9689 Other specified bacterial agents as the cause of diseases classified elsewhere: Secondary | ICD-10-CM | POA: Diagnosis not present

## 2023-06-04 DIAGNOSIS — J029 Acute pharyngitis, unspecified: Secondary | ICD-10-CM

## 2023-06-04 LAB — POCT RAPID STREP A (OFFICE): Rapid Strep A Screen: NEGATIVE

## 2023-06-04 MED ORDER — AZITHROMYCIN 250 MG PO TABS: ORAL_TABLET | ORAL | 0 refills | Status: AC

## 2023-06-04 MED ORDER — PROMETHAZINE-DM 6.25-15 MG/5ML PO SYRP: 5.0000 mL | ORAL_SOLUTION | Freq: Four times a day (QID) | ORAL | 0 refills | Status: AC | PRN

## 2023-06-04 MED ORDER — AMOXICILLIN-POT CLAVULANATE 875-125 MG PO TABS
1.0000 | ORAL_TABLET | Freq: Two times a day (BID) | ORAL | 0 refills | Status: AC
Start: 2023-06-04 — End: ?

## 2023-06-04 MED ORDER — FLUCONAZOLE 150 MG PO TABS: 150.0000 mg | ORAL_TABLET | Freq: Once | ORAL | 2 refills | Status: AC

## 2023-06-04 NOTE — Progress Notes (Signed)
  Tollie Eth, DNP, AGNP-c [facility] [address] [place], [place] [address] [phone]   ACUTE VISIT- ESTABLISHED PATIENT  Blood pressure 118/72, pulse 80, weight 140 lb 9.6 oz (63.8 kg).  Subjective:  HPI Jillian LIGGON is a 22 y.o. female presents to day for evaluation of acute concern(s).   History of Present Illness Khodi presents with a persistent cough and mucus production. She reports no shortness of breath, but describes the cough as severe enough to cause gagging. The cough is particularly bothersome at night, disrupting sleep, despite a routine of tea and Robitussin before bed. Upon waking, the patient experiences a sore throat and congestion. She also reports some facial pressure. The patient denies any fevers, with temperatures consistently below 99 degrees. A recent strep test was negative. The patient has been managing symptoms with over-the-counter Robitussin and tea. She has been experiencing these symptoms for several weeks.  ROS negative except for what is listed in HPI. History, Medications, Surgery, SDOH, and Family History reviewed and updated as appropriate.  Objective:  Physical Exam Vitals and nursing note reviewed.  Constitutional:      General: She is not in acute distress.    Appearance: Normal appearance.  Eyes:     Conjunctiva/sclera: Conjunctivae normal.  Cardiovascular:     Rate and Rhythm: Normal rate and regular rhythm.     Pulses: Normal pulses.     Heart sounds: Normal heart sounds.  Pulmonary:     Breath sounds: Wheezing and rhonchi present.  Lymphadenopathy:     Cervical: Cervical adenopathy present.  Skin:    General: Skin is warm and dry.     Capillary Refill: Capillary refill takes less than 2 seconds.  Neurological:     Mental Status: She is alert and oriented to person, place, and time.  Psychiatric:        Mood and Affect: Mood normal.          Assessment & Plan:   Problem List Items Addressed This  Visit     Bacterial URI - Primary    Persistent cough with mucus production for several weeks. Negative strep test. No shortness of breath. Mild facial pressure. No fever. Fullness in the lungs noted on examination. Likely bacterial etiology given prolonged symptoms. Discussed risks of antibiotics including potential yeast infection and stomach upset. Advised that antibiotics should improve symptoms within 5 days, but full course should be completed to prevent recurrence. - Prescribe antibiotics for 7 days - Prescribe antifungal medication for potential yeast infection - Prescribe decongestant cough medicine, to be taken up to four times a day, especially at bedtime - Advise to take antibiotics with food to prevent stomach upset - Instruct to complete the full course of antibiotics even if symptoms improve by day 5 - Advise to contact if symptoms do not improve by the end of the antibiotic course       Relevant Medications   amoxicillin-clavulanate (AUGMENTIN) 875-125 MG tablet   promethazine-dextromethorphan (PROMETHAZINE-DM) 6.25-15 MG/5ML syrup   Other Visit Diagnoses     Sore throat       Relevant Orders   Rapid Strep A (Completed)         Tollie Eth, DNP, AGNP-c

## 2023-06-14 DIAGNOSIS — B9689 Other specified bacterial agents as the cause of diseases classified elsewhere: Secondary | ICD-10-CM | POA: Insufficient documentation

## 2023-06-14 NOTE — Assessment & Plan Note (Signed)
Persistent cough with mucus production for several weeks. Negative strep test. No shortness of breath. Mild facial pressure. No fever. Fullness in the lungs noted on examination. Likely bacterial etiology given prolonged symptoms. Discussed risks of antibiotics including potential yeast infection and stomach upset. Advised that antibiotics should improve symptoms within 5 days, but full course should be completed to prevent recurrence. - Prescribe antibiotics for 7 days - Prescribe antifungal medication for potential yeast infection - Prescribe decongestant cough medicine, to be taken up to four times a day, especially at bedtime - Advise to take antibiotics with food to prevent stomach upset - Instruct to complete the full course of antibiotics even if symptoms improve by day 5 - Advise to contact if symptoms do not improve by the end of the antibiotic course

## 2023-06-30 ENCOUNTER — Other Ambulatory Visit: Payer: 59

## 2024-03-03 ENCOUNTER — Telehealth: Payer: Self-pay | Admitting: Nurse Practitioner

## 2024-03-03 ENCOUNTER — Encounter: Payer: Self-pay | Admitting: Nurse Practitioner

## 2024-03-03 NOTE — Telephone Encounter (Signed)
 Copied from CRM #[id number]. Topic: Appointments - Scheduling Inquiry for Clinic >> Mar 03, 2024  1:43 PM Graeme ORN wrote: Reason for CRM: Patient get called. Was able to access immunization record on Mychart. Not showing 2nd vaccine B. She received alert it was due and needs it completed by 8/18 for school so it can be sent to school by deadline. Would like to come Monday if possible. Per KMS Send CRM to schedule  ?ok to schedule

## 2024-03-03 NOTE — Telephone Encounter (Signed)
 Per chart and [facility] patient has NOT had 2nd Mening vax     Please advise, thank you!

## 2024-03-04 ENCOUNTER — Other Ambulatory Visit (INDEPENDENT_AMBULATORY_CARE_PROVIDER_SITE_OTHER)

## 2024-03-04 DIAGNOSIS — Z23 Encounter for immunization: Secondary | ICD-10-CM

## 2024-03-07 ENCOUNTER — Telehealth: Payer: Self-pay | Admitting: Nurse Practitioner

## 2024-03-07 NOTE — Telephone Encounter (Signed)
 Spoke to patient, she thought so also but said was not on print out she was given. Faxed new Vaccine registry report to school and mailed copy to patient

## 2024-03-07 NOTE — Telephone Encounter (Signed)
 Copied from CRM #[id number]. Topic: Appointments - Scheduling Inquiry for Clinic >> Mar 07, 2024 11:02 AM Treva T wrote: Reason for CRM: Received call from patient, requesting to schedule an appointment for a meningococcal injection for school.  Patient reports the specific name of injection school requires is the [employer] Meningo MCV4.  Patient can be reached at [phone]  for scheduling.  ? Ok to schedule

## 2024-03-23 ENCOUNTER — Ambulatory Visit: Payer: Self-pay | Admitting: *Deleted

## 2024-03-23 NOTE — Telephone Encounter (Signed)
 Copied from CRM #[id number]. Topic: Clinical - Red Word Triage >> Mar 23, 2024 11:30 AM Graeme ORN wrote: Red Word that prompted transfer to Nurse Triage: Throat extremely sore and uncomfortable, worsening, trouble swallowing, a little swollen Reason for Disposition  [1] Sore throat is the only symptom AND [2] present > 48 hours  Answer Assessment - Initial Assessment Questions 1. ONSET: When did the throat start hurting? (Hours or days ago)      I have a sore throat.   It started 4-5 days ago.  It has not gotten any better.  It feels worse.  I had strep 3 yrs ago and it was terrible.   I just don't want to have it again. 2. SEVERITY: How bad is the sore throat? (Scale 1-10; mild, moderate or severe)     Severe 3. STREP EXPOSURE: Has there been any exposure to strep within the past week? If Yes, ask: What type of contact occurred?      No School just started back.    4.  VIRAL SYMPTOMS: Are there any symptoms of a cold, such as a runny nose, cough, hoarse voice or red eyes?      No   just the sore throat 5. FEVER: Do you have a fever? If Yes, ask: What is your temperature, how was it measured, and when did it start?     No 6. PUS ON THE TONSILS: Is there pus on the tonsils in the back of your throat?     I'm not sure 7. OTHER SYMPTOMS: Do you have any other symptoms? (e.g., difficulty breathing, headache, rash)     No 8. PREGNANCY: Is there any chance you are pregnant? When was your last menstrual period?     Not asked  Protocols used: Sore Throat-A-AH FYI Only or Action Required?: FYI only for provider.  Patient was last seen in primary care on 06/04/2023 by Early, Camie BRAVO, NP.  Called Nurse Triage reporting Sore Throat.throat lozenges  Symptoms began a week ago.  Interventions attempted: OTC medications: throat lozenges   Sore throat not going away .  Symptoms are: unchanged.Denies other symptoms  Triage Disposition: See PCP When Office is Open (Within 3  Days)  Patient/caregiver understands and will follow disposition?: Yes

## 2024-03-24 ENCOUNTER — Ambulatory Visit: Admitting: Family Medicine

## 2024-03-24 ENCOUNTER — Encounter: Payer: Self-pay | Admitting: Family Medicine

## 2024-03-24 VITALS — BP 116/72 | HR 50 | Temp 97.4°F | Wt 149.8 lb

## 2024-03-24 DIAGNOSIS — J039 Acute tonsillitis, unspecified: Secondary | ICD-10-CM | POA: Diagnosis not present

## 2024-03-24 LAB — POCT RAPID STREP A (OFFICE): Rapid Strep A Screen: NEGATIVE

## 2024-03-24 MED ORDER — CHLORHEXIDINE GLUCONATE 0.12 % MT SOLN: 15.0000 mL | Freq: Two times a day (BID) | OROMUCOSAL | 0 refills | Status: AC

## 2024-03-24 MED ORDER — METHYLPREDNISOLONE 4 MG PO TBPK
ORAL_TABLET | ORAL | 0 refills | Status: AC
Start: 2024-03-24 — End: ?

## 2024-03-24 NOTE — Progress Notes (Signed)
   Name: Jillian Harris   Date of Visit: 03/24/24   Date of last visit with me: Visit date not found   CHIEF COMPLAINT:  Chief Complaint  Patient presents with   Acute Visit    Very sore throat for a week. No other symptoms.        HPI:  Discussed the use of AI scribe software for clinical note transcription with the patient, who gave verbal consent to proceed.  History of Present Illness   Jillian Harris is a 23 year old female who presents with a sore throat persisting for one week.  She has been experiencing a sore throat for approximately one week, which remains consistently painful throughout the day. Despite trying various home remedies such as hot tea, salt water, hot soup, ice cream, and lollipops, she has not found relief. The symptoms have neither worsened nor improved over this period.  She has primarily relied on home remedies and has not mentioned taking medications such as Tylenol  for her sore throat. Her social history includes working in [employer] and recently returning to school, which she suspects may have contributed to her illness.  She recalls having frequent sore throats as a child and has been informed in the past that her tonsils can become swollen.         OBJECTIVE:       07/08/2022    9:07 AM  Depression screen PHQ 2/9  Decreased Interest 0  Down, Depressed, Hopeless 0  PHQ - 2 Score 0     BP Readings from Last 3 Encounters:  03/24/24 116/72  06/04/23 118/72  01/27/23 114/72    BP 116/72   Pulse (!) 50   Temp (!) 97.4 F (36.3 C)   Wt 149 lb 12.8 oz (67.9 kg)   SpO2 99%   BMI 26.96 kg/m    Physical Exam   HEENT: Throat red and inflamed, no signs of infection.      Physical Exam HENT:     Mouth/Throat:     Mouth: Mucous membranes are moist.     Pharynx: Oropharynx is clear. Posterior oropharyngeal erythema present. No oropharyngeal exudate.     Comments: Swollen tonsils      ASSESSMENT/PLAN:   Assessment &  Plan Tonsillitis    Assessment and Plan    Acute tonsillitis Persistent sore throat with tonsillar inflammation, likely viral. Strep test negative. - Prescribed steroids for one week to reduce inflammation. - Prescribed mouthwash twice daily during steroid course and as needed for future symptoms. - Sent prescriptions to [facility] on [address].         Jillian Harris A. Vita MD [facility]
